# Patient Record
Sex: Male | Born: 1970 | Race: White | Hispanic: No | Marital: Married | Smoking: Former smoker
Health system: Southern US, Community
[De-identification: ages and names within clinical notes are randomized; demographics above are authoritative.]

## PROBLEM LIST (undated history)

## (undated) DIAGNOSIS — I10 Essential (primary) hypertension: Secondary | ICD-10-CM

## (undated) DIAGNOSIS — E785 Hyperlipidemia, unspecified: Secondary | ICD-10-CM

## (undated) DIAGNOSIS — F329 Major depressive disorder, single episode, unspecified: Secondary | ICD-10-CM

## (undated) HISTORY — PX: APPENDECTOMY: SHX54

## (undated) HISTORY — DX: Hyperlipidemia, unspecified: E78.5

---

## 2006-01-20 ENCOUNTER — Emergency Department (HOSPITAL_COMMUNITY): Admission: EM | Admit: 2006-01-20 | Discharge: 2006-01-20 | Payer: Self-pay | Admitting: Emergency Medicine

## 2007-10-18 ENCOUNTER — Emergency Department (HOSPITAL_COMMUNITY): Admission: EM | Admit: 2007-10-18 | Discharge: 2007-10-18 | Payer: Self-pay | Admitting: Emergency Medicine

## 2009-09-10 ENCOUNTER — Emergency Department (HOSPITAL_COMMUNITY): Admission: EM | Admit: 2009-09-10 | Discharge: 2009-09-11 | Payer: Self-pay | Admitting: Emergency Medicine

## 2011-03-24 LAB — URINE CULTURE: Culture: NO GROWTH

## 2011-03-24 LAB — GC/CHLAMYDIA PROBE AMP, GENITAL: Chlamydia, DNA Probe: NEGATIVE

## 2011-03-24 LAB — URINALYSIS, ROUTINE W REFLEX MICROSCOPIC
Ketones, ur: NEGATIVE
Protein, ur: NEGATIVE
Specific Gravity, Urine: 1.025
pH: 6

## 2013-03-10 ENCOUNTER — Encounter (HOSPITAL_COMMUNITY): Payer: Self-pay | Admitting: Emergency Medicine

## 2013-03-10 ENCOUNTER — Emergency Department (HOSPITAL_COMMUNITY)
Admission: EM | Admit: 2013-03-10 | Discharge: 2013-03-10 | Disposition: A | Discharge status: Home / Self Care | Payer: Self-pay | Attending: Emergency Medicine | Admitting: Emergency Medicine

## 2013-03-10 DIAGNOSIS — S80862A Insect bite (nonvenomous), left lower leg, initial encounter: Secondary | ICD-10-CM

## 2013-03-10 DIAGNOSIS — Y929 Unspecified place or not applicable: Secondary | ICD-10-CM | POA: Insufficient documentation

## 2013-03-10 DIAGNOSIS — I10 Essential (primary) hypertension: Secondary | ICD-10-CM | POA: Insufficient documentation

## 2013-03-10 DIAGNOSIS — Y939 Activity, unspecified: Secondary | ICD-10-CM | POA: Insufficient documentation

## 2013-03-10 DIAGNOSIS — S90569A Insect bite (nonvenomous), unspecified ankle, initial encounter: Secondary | ICD-10-CM | POA: Insufficient documentation

## 2013-03-10 HISTORY — DX: Essential (primary) hypertension: I10

## 2013-03-10 MED ORDER — PREDNISONE 50 MG PO TABS
60.0000 mg | ORAL_TABLET | Freq: Once | ORAL | Status: AC
  Administered 2013-03-10: 60 mg via ORAL
  Filled 2013-03-10: qty 1

## 2013-03-10 MED ORDER — IBUPROFEN 800 MG PO TABS
800.0000 mg | ORAL_TABLET | Freq: Once | ORAL | Status: AC
  Administered 2013-03-10: 800 mg via ORAL
  Filled 2013-03-10: qty 1

## 2013-03-10 MED ORDER — SULFAMETHOXAZOLE-TMP DS 800-160 MG PO TABS
1.0000 | ORAL_TABLET | Freq: Once | ORAL | Status: AC
  Administered 2013-03-10: 1 via ORAL
  Filled 2013-03-10: qty 1

## 2013-03-10 MED ORDER — ONDANSETRON HCL 4 MG PO TABS
4.0000 mg | ORAL_TABLET | Freq: Once | ORAL | Status: AC
  Administered 2013-03-10: 4 mg via ORAL
  Filled 2013-03-10: qty 1

## 2013-03-10 MED ORDER — SULFAMETHOXAZOLE-TMP DS 800-160 MG PO TABS: 1.0000 | ORAL_TABLET | Freq: Two times a day (BID) | ORAL | Status: DC

## 2013-03-10 MED ORDER — DEXAMETHASONE 4 MG PO TABS: 4.0000 mg | ORAL_TABLET | Freq: Two times a day (BID) | ORAL | Status: DC

## 2013-03-10 NOTE — ED Notes (Signed)
Pt c/o bug bite to the left leg last night. The area is red.

## 2013-03-10 NOTE — ED Notes (Signed)
nad noted prior to dc. Dc instructions reviewed and explained. 2 scripts given

## 2013-03-10 NOTE — ED Provider Notes (Signed)
CSN: [id number]     Arrival date & time 03/10/13  2303 History   First MD Initiated Contact with Patient 03/10/13 2308     Chief Complaint  Patient presents with  . Insect Bite   (Consider location/radiation/quality/duration/timing/severity/associated sxs/prior Treatment) HPI Comments: Pt c/o a red raised area of the lower left leg. This was noted last night and believed to be related to a bug bite. No hives or problems with breathing or walking reported. No fever or chills. Pt applied ice, but the area is worse today. He present to ED for evaluation of the bite as the area seems warm to touch and he is concerned for infection.  The history is provided by the patient.    Past Medical History  Diagnosis Date  . Hypertension    Past Surgical History  Procedure Laterality Date  . Appendectomy     History reviewed. No pertinent family history. History  Substance Use Topics  . Smoking status: Never Smoker   . Smokeless tobacco: Not on file  . Alcohol Use: No    Review of Systems  Constitutional: Negative for activity change.       All ROS Neg except as noted in HPI  HENT: Negative for nosebleeds and neck pain.   Eyes: Negative for photophobia and discharge.  Respiratory: Negative for cough, shortness of breath and wheezing.   Cardiovascular: Negative for chest pain and palpitations.  Gastrointestinal: Negative for abdominal pain and blood in stool.  Genitourinary: Negative for dysuria, frequency and hematuria.  Musculoskeletal: Negative for back pain and arthralgias.  Skin: Negative.   Neurological: Negative for dizziness, seizures and speech difficulty.  Psychiatric/Behavioral: Negative for hallucinations and confusion.    Allergies  Review of patient's allergies indicates no known allergies.  Home Medications  No current outpatient prescriptions on file. BP 136/82  Pulse 65  Temp(Src) 98.3 F (36.8 C) (Oral)  Resp 17  Ht 5' 8.5" (1.74 m)  Wt 200 lb (90.719 kg)   BMI 29.96 kg/m2 Physical Exam  Nursing note and vitals reviewed. Constitutional: He is oriented to person, place, and time. He appears well-developed and well-nourished.  Non-toxic appearance.  HENT:  Head: Normocephalic.  Right Ear: Tympanic membrane and external ear normal.  Left Ear: Tympanic membrane and external ear normal.  Eyes: EOM and lids are normal. Pupils are equal, round, and reactive to light.  Neck: Normal range of motion. Neck supple. Carotid bruit is not present.  Cardiovascular: Normal rate, regular rhythm, normal heart sounds, intact distal pulses and normal pulses.   Pulmonary/Chest: Breath sounds normal. No respiratory distress.  Abdominal: Soft. Bowel sounds are normal. There is no tenderness. There is no guarding.  Musculoskeletal: Normal range of motion.       Legs: No drainage from the bite area. No red streaking. FROm of the left lower extremity. No palpable inguinal nodes.  Lymphadenopathy:       Head (right side): No submandibular adenopathy present.       Head (left side): No submandibular adenopathy present.    He has no cervical adenopathy.  Neurological: He is alert and oriented to person, place, and time. He has normal strength. No cranial nerve deficit or sensory deficit.  Skin: Skin is warm and dry.  Psychiatric: He has a normal mood and affect. His speech is normal.    ED Course  Procedures (including critical care time) Labs Review Labs Reviewed - No data to display Imaging Review No results found.  MDM  No diagnosis  found. **I have reviewed nursing notes, vital signs, and all appropriate lab and imaging results for this patient.*  Pt sustained a bug bite to the left lower leg. No reported fever or n/v. No c/o pain to palpation or with walking. Plan - Rx for decadron and septra given to the patient. Pt to use warm tub soaks. He will return if any changes or problem.  Kathie Dike, PA-C 03/16/13 1406

## 2013-03-17 NOTE — ED Provider Notes (Signed)
Medical screening examination/treatment/procedure(s) were performed by non-physician practitioner and as supervising physician I was immediately available for consultation/collaboration.   Dione Booze, MD 03/17/13 [id number]

## 2013-06-29 DIAGNOSIS — F32A Depression, unspecified: Secondary | ICD-10-CM

## 2013-06-29 HISTORY — DX: Depression, unspecified: F32.A

## 2018-07-14 ENCOUNTER — Encounter (HOSPITAL_COMMUNITY): Payer: Self-pay | Admitting: Emergency Medicine

## 2018-07-14 ENCOUNTER — Other Ambulatory Visit: Payer: Self-pay | Admitting: Nurse Practitioner

## 2018-07-14 ENCOUNTER — Other Ambulatory Visit: Payer: Self-pay

## 2018-07-14 ENCOUNTER — Emergency Department (HOSPITAL_COMMUNITY): Payer: BLUE CROSS/BLUE SHIELD

## 2018-07-14 ENCOUNTER — Emergency Department (HOSPITAL_COMMUNITY)
Admission: EM | Admit: 2018-07-14 | Discharge: 2018-07-14 | Disposition: A | Discharge status: Home / Self Care | Payer: BLUE CROSS/BLUE SHIELD | Attending: Emergency Medicine | Admitting: Emergency Medicine

## 2018-07-14 DIAGNOSIS — I10 Essential (primary) hypertension: Secondary | ICD-10-CM | POA: Diagnosis not present

## 2018-07-14 DIAGNOSIS — Z79899 Other long term (current) drug therapy: Secondary | ICD-10-CM | POA: Diagnosis not present

## 2018-07-14 DIAGNOSIS — K5792 Diverticulitis of intestine, part unspecified, without perforation or abscess without bleeding: Secondary | ICD-10-CM | POA: Diagnosis not present

## 2018-07-14 DIAGNOSIS — R1032 Left lower quadrant pain: Secondary | ICD-10-CM | POA: Diagnosis present

## 2018-07-14 HISTORY — DX: Major depressive disorder, single episode, unspecified: F32.9

## 2018-07-14 LAB — COMPREHENSIVE METABOLIC PANEL WITH GFR
ALT: 30 U/L (ref 0–44)
AST: 25 U/L (ref 15–41)
Albumin: 3.5 g/dL (ref 3.5–5.0)
Alkaline Phosphatase: 56 U/L (ref 38–126)
Anion gap: 7 (ref 5–15)
BUN: 16 mg/dL (ref 6–20)
CO2: 26 mmol/L (ref 22–32)
Calcium: 8.6 mg/dL — ABNORMAL LOW (ref 8.9–10.3)
Chloride: 107 mmol/L (ref 98–111)
Creatinine, Ser: 0.66 mg/dL (ref 0.61–1.24)
GFR calc Af Amer: >60 mL/min
GFR calc non Af Amer: >60 mL/min
Glucose, Bld: 87 mg/dL (ref 70–99)
Potassium: 3.6 mmol/L (ref 3.5–5.1)
Sodium: 140 mmol/L (ref 135–145)
Total Bilirubin: 0.6 mg/dL (ref 0.3–1.2)
Total Protein: 6.9 g/dL (ref 6.5–8.1)

## 2018-07-14 LAB — URINALYSIS, ROUTINE W REFLEX MICROSCOPIC
Bilirubin Urine: NEGATIVE
Glucose, UA: NEGATIVE mg/dL
Hgb urine dipstick: NEGATIVE
KETONES UR: NEGATIVE mg/dL
LEUKOCYTES UA: NEGATIVE
Nitrite: NEGATIVE
PROTEIN: NEGATIVE mg/dL
Specific Gravity, Urine: >1.046 — ABNORMAL HIGH (ref 1.005–1.030)
pH: 6 (ref 5.0–8.0)

## 2018-07-14 LAB — CBC WITH DIFFERENTIAL/PLATELET
Abs Immature Granulocytes: 0.02 10*3/uL (ref 0.00–0.07)
BASOS PCT: 1 %
Basophils Absolute: 0 10*3/uL (ref 0.0–0.1)
EOS ABS: 0.1 10*3/uL (ref 0.0–0.5)
EOS PCT: 3 %
HCT: 40.6 % (ref 39.0–52.0)
Hemoglobin: 13.1 g/dL (ref 13.0–17.0)
IMMATURE GRANULOCYTES: 0 %
Lymphocytes Relative: 23 %
Lymphs Abs: 1.2 10*3/uL (ref 0.7–4.0)
MCH: 26.9 pg (ref 26.0–34.0)
MCHC: 32.3 g/dL (ref 30.0–36.0)
MCV: 83.4 fL (ref 80.0–100.0)
MONO ABS: 0.6 10*3/uL (ref 0.1–1.0)
Monocytes Relative: 12 %
NEUTROS PCT: 61 %
Neutro Abs: 3.3 10*3/uL (ref 1.7–7.7)
PLATELETS: 181 10*3/uL (ref 150–400)
RBC: 4.87 MIL/uL (ref 4.22–5.81)
RDW: 12.5 % (ref 11.5–15.5)
WBC: 5.4 10*3/uL (ref 4.0–10.5)
nRBC: 0 % (ref 0.0–0.2)

## 2018-07-14 LAB — LIPASE, BLOOD: Lipase: 32 U/L (ref 11–51)

## 2018-07-14 MED ORDER — CIPROFLOXACIN HCL 250 MG PO TABS
500.0000 mg | ORAL_TABLET | Freq: Once | ORAL | Status: AC
  Administered 2018-07-14: 500 mg via ORAL
  Filled 2018-07-14: qty 2

## 2018-07-14 MED ORDER — CIPROFLOXACIN HCL 500 MG PO TABS: 500.0000 mg | ORAL_TABLET | Freq: Two times a day (BID) | ORAL | 0 refills | Status: DC

## 2018-07-14 MED ORDER — IOHEXOL 300 MG/ML  SOLN
100.0000 mL | Freq: Once | INTRAMUSCULAR | Status: AC | PRN
  Administered 2018-07-14: 100 mL via INTRAVENOUS

## 2018-07-14 MED ORDER — METRONIDAZOLE 500 MG PO TABS: 500.0000 mg | ORAL_TABLET | Freq: Four times a day (QID) | ORAL | 0 refills | Status: DC

## 2018-07-14 MED ORDER — METRONIDAZOLE 500 MG PO TABS
500.0000 mg | ORAL_TABLET | Freq: Once | ORAL | Status: AC
  Administered 2018-07-14: 500 mg via ORAL
  Filled 2018-07-14: qty 1

## 2018-07-14 NOTE — Discharge Instructions (Addendum)
Prescription for 2 antibiotics.  Try to eat a well-balanced diet.  Follow-up with your primary care doctor.

## 2018-07-14 NOTE — ED Provider Notes (Signed)
[facility] EMERGENCY DEPARTMENT Provider Note   CSN: [id number] Arrival date & time: 07/14/18  1156     History   Chief Complaint Chief Complaint  Patient presents with  . Abdominal Pain    HPI Alan Guerrero is a 48 y.o. male.  Left lower quadrant abdominal pain for approximately 5 to 6 days.  Patient has been able to eat without vomiting.  No fever, chills, dysuria, hematuria, change in bowel habits.  He does however have night sweats.  This symptom is unusual for patient.  He is normally healthy.  He was seen in urgent care center today and sent to the emergency department for further evaluation.  Severity of symptoms is moderate.  Palpation makes pain worse.     Past Medical History:  Diagnosis Date  . Depression 2015  . Hypertension     There are no active problems to display for this patient.   Past Surgical History:  Procedure Laterality Date  . APPENDECTOMY          Home Medications    Prior to Admission medications   Medication Sig Start Date End Date Taking? Authorizing Provider  sertraline (ZOLOFT) 50 MG tablet Take by mouth. 09/22/17  Yes [provider]  ciprofloxacin (CIPRO) 500 MG tablet Take 1 tablet (500 mg total) by mouth 2 (two) times daily. 07/14/18   Donnetta Hutching, MD  metroNIDAZOLE (FLAGYL) 500 MG tablet Take 1 tablet (500 mg total) by mouth 4 (four) times daily. 07/14/18   Donnetta Hutching, MD  sulfamethoxazole-trimethoprim (BACTRIM DS) 800-160 MG per tablet Take 1 tablet by mouth 2 (two) times daily. Patient not taking: Reported on 07/14/2018 03/10/13   Ivery Quale, PA-C    Family History History reviewed. No pertinent family history.  Social History Social History   Tobacco Use  . Smoking status: Never Smoker  . Smokeless tobacco: Never Used  Substance Use Topics  . Alcohol use: No  . Drug use: No     Allergies   Codeine   Review of Systems Review of Systems  All other systems reviewed and are  negative.    Physical Exam Updated Vital Signs BP 139/87   Pulse 67   Temp 98 F (36.7 C)   Resp 18   Ht 5' 8.5" (1.74 m)   Wt 91.2 kg   SpO2 100%   BMI 30.12 kg/m   Physical Exam Vitals signs and nursing note reviewed.  Constitutional:      Appearance: He is well-developed.     Comments: nad  HENT:     Head: Normocephalic and atraumatic.  Eyes:     Conjunctiva/sclera: Conjunctivae normal.  Neck:     Musculoskeletal: Neck supple.  Cardiovascular:     Rate and Rhythm: Normal rate and regular rhythm.  Pulmonary:     Effort: Pulmonary effort is normal.     Breath sounds: Normal breath sounds.  Abdominal:     General: Bowel sounds are normal.     Palpations: Abdomen is soft.     Comments: Tender left lower quadrant.  Musculoskeletal: Normal range of motion.  Skin:    General: Skin is warm and dry.  Neurological:     Mental Status: He is alert and oriented to person, place, and time.  Psychiatric:        Behavior: Behavior normal.      ED Treatments / Results  Labs (all labs ordered are listed, but only abnormal results are displayed) Labs Reviewed  COMPREHENSIVE METABOLIC PANEL -  Abnormal; Notable for the following components:      Result Value   Calcium 8.6 (*)    All other components within normal limits  URINALYSIS, ROUTINE W REFLEX MICROSCOPIC - Abnormal; Notable for the following components:   Color, Urine STRAW (*)    Specific Gravity, Urine >1.046 (*)    All other components within normal limits  CBC WITH DIFFERENTIAL/PLATELET  LIPASE, BLOOD    EKG None  Radiology Ct Abdomen Pelvis W Contrast  Result Date: 07/14/2018 CLINICAL DATA:  Acute abdominal pain.  Evaluate for diverticulitis. EXAM: CT ABDOMEN AND PELVIS WITH CONTRAST TECHNIQUE: Multidetector CT imaging of the abdomen and pelvis was performed using the standard protocol following bolus administration of intravenous contrast. CONTRAST:  100mL OMNIPAQUE IOHEXOL 300 MG/ML  SOLN COMPARISON:   None. FINDINGS: Lower chest: Normal heart size. Dependent atelectasis within the bilateral lower lobes. No pleural effusion. Hepatobiliary: The liver is normal in size and contour. No focal lesion is identified. Gallbladder is unremarkable. No intrahepatic or extrahepatic biliary ductal dilatation. Pancreas: Unremarkable Spleen: Unremarkable Adrenals/Urinary Tract: Normal adrenal glands. Kidneys enhance symmetrically with contrast. No hydronephrosis. Urinary bladder is unremarkable. Stomach/Bowel: There is focal circumferential wall thickening of the descending colon (image 60; series 2) with surrounding fat stranding and fluid. Findings favored to represent diverticulitis. No extreme bowel obstruction. No evidence for free intraperitoneal air. Normal morphology of the stomach. Vascular/Lymphatic: Normal caliber abdominal aorta. Peripheral calcified atherosclerotic plaque. No retroperitoneal lymphadenopathy. Reproductive: Prostate unremarkable. Other: None. Musculoskeletal: Lumbar spine degenerative changes. No aggressive or acute appearing osseous lesions. IMPRESSION: Focal circumferential wall thickening of the descending colon with pericolonic fat stranding and fluid most compatible with acute colonic diverticulitis. No CT evidence to suggest perforation. Electronically Signed   By: Annia Beltrew  Davis M.D.   On: 07/14/2018 15:29    Procedures Procedures (including critical care time)  Medications Ordered in ED Medications  iohexol (OMNIPAQUE) 300 MG/ML solution 100 mL (100 mLs Intravenous Contrast Given 07/14/18 1508)  ciprofloxacin (CIPRO) tablet 500 mg (500 mg Oral Given 07/14/18 1833)  metroNIDAZOLE (FLAGYL) tablet 500 mg (500 mg Oral Given 07/14/18 1833)     Initial Impression / Assessment and Plan / ED Course  I have reviewed the triage vital signs and the nursing notes.  Pertinent labs & imaging results that were available during my care of the patient were reviewed by me and considered in my medical  decision making (see chart for details).     History and physical consistent with diverticulitis.  CT scan confirms same.  Patient is hemodynamically stable.  Will treat as an outpatient with oral Cipro and Flagyl.  Discussed findings with the patient and his wife.  Final Clinical Impressions(s) / ED Diagnoses   Final diagnoses:  Diverticulitis    ED Discharge Orders         Ordered    ciprofloxacin (CIPRO) 500 MG tablet  2 times daily     07/14/18 1822    metroNIDAZOLE (FLAGYL) 500 MG tablet  4 times daily     07/14/18 Aram [id number]           Dacoda Finlay, MD 07/14/18 2126

## 2018-07-14 NOTE — ED Triage Notes (Signed)
Pt c/o LLQ abd pain x 1 week, pt saw PCP and was sent to ED stating he needs a CT scan, denies n/v/d, unsure of fever

## 2020-12-01 IMAGING — CT CT ABD-PELV W/ CM
2 of 5 series · 16 of 46 positions shown, 18 images · IV contrast (Isovue)
Comparison: None.

CLINICAL DATA: Acute abdominal pain.  Evaluate for diverticulitis.

EXAM:
CT ABDOMEN AND PELVIS WITH CONTRAST
TECHNIQUE: Multidetector CT imaging of the abdomen and pelvis was performed
using the standard protocol following bolus administration of
intravenous contrast.
CONTRAST:  100mL OMNIPAQUE IOHEXOL 300 MG/ML  SOLN

[Series 2: axial st · axial · 0.84mm/px · z∈[+987,+1417]mm · 13 of 98 slices shown, 15 images]
[im 6/98  soft-tissue]
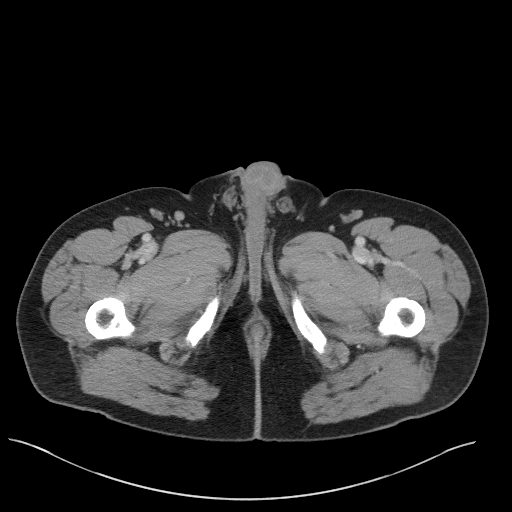
[im 6/98  bone]
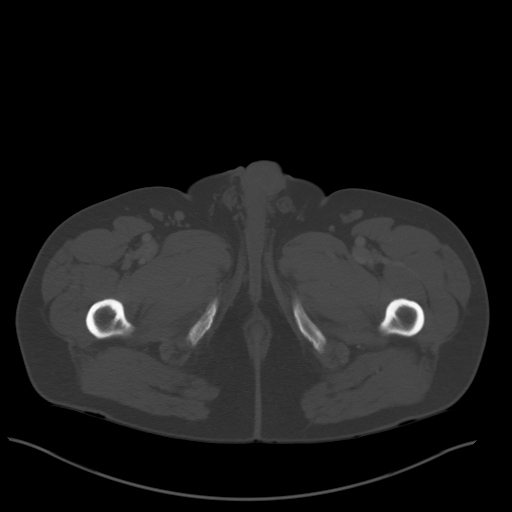
[im 11/98  soft-tissue]
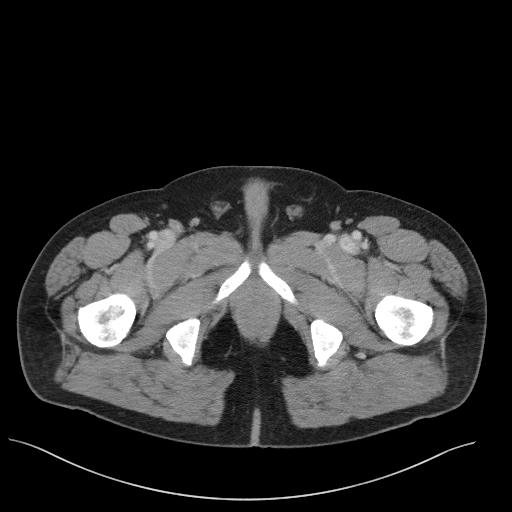
[im 22/98  soft-tissue]
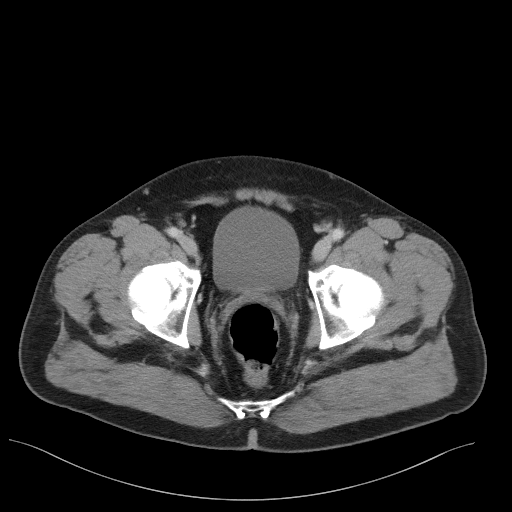
[im 27/98  soft-tissue]
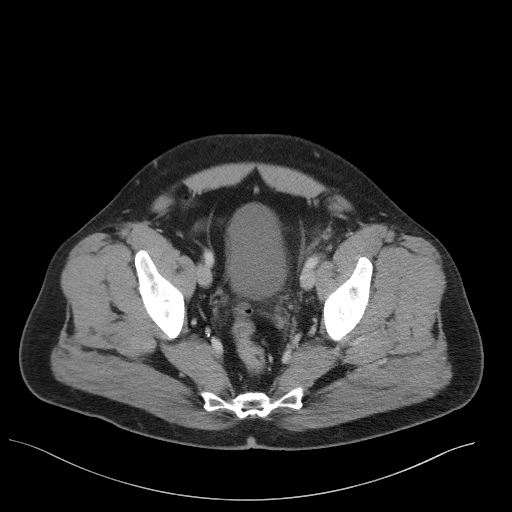
[im 33/98  soft-tissue]
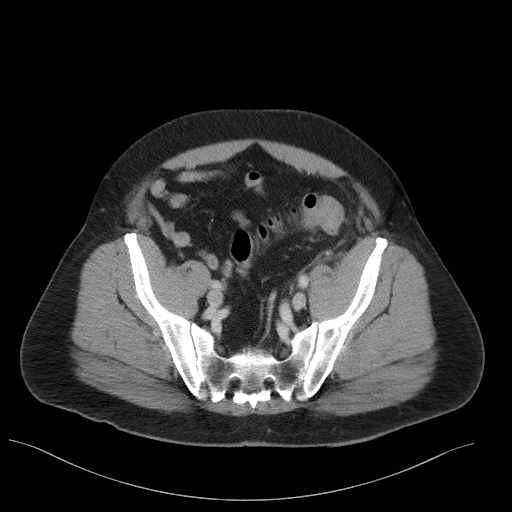
[im 44/98  soft-tissue]
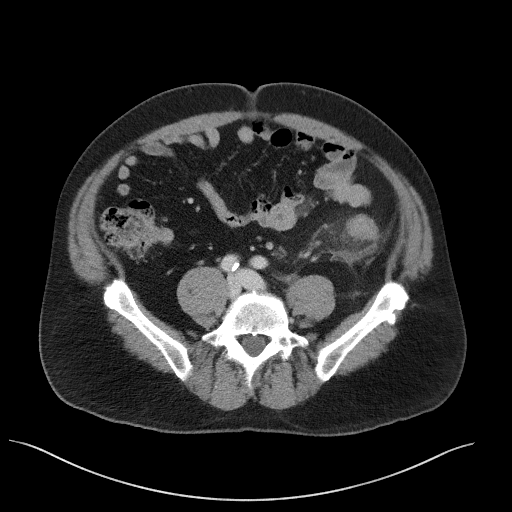
[im 49/98  soft-tissue]
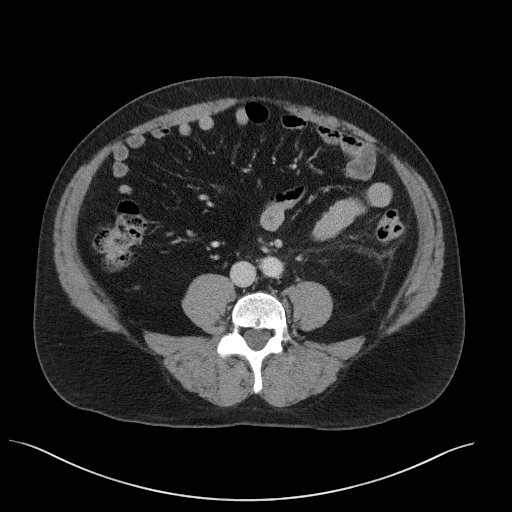
[im 54/98  soft-tissue]
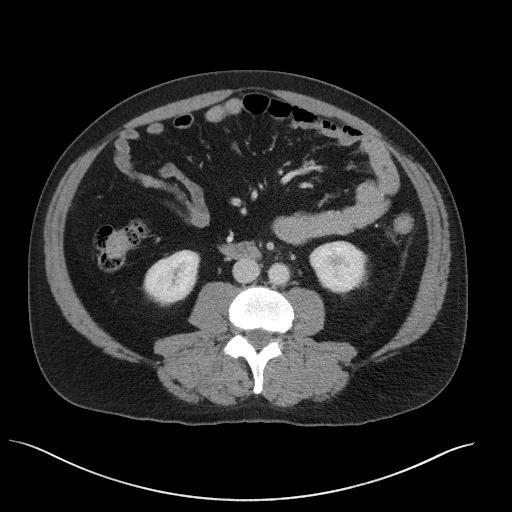
[im 65/98  soft-tissue]
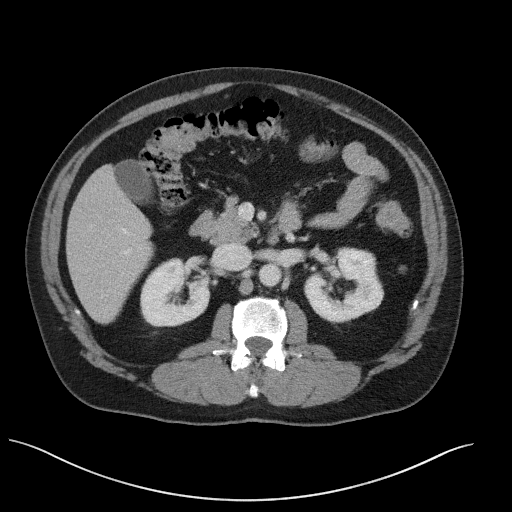
[im 65/98  bone]
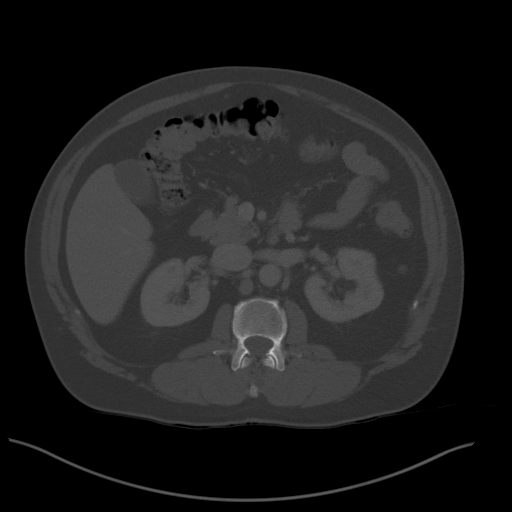
[im 71/98  soft-tissue]
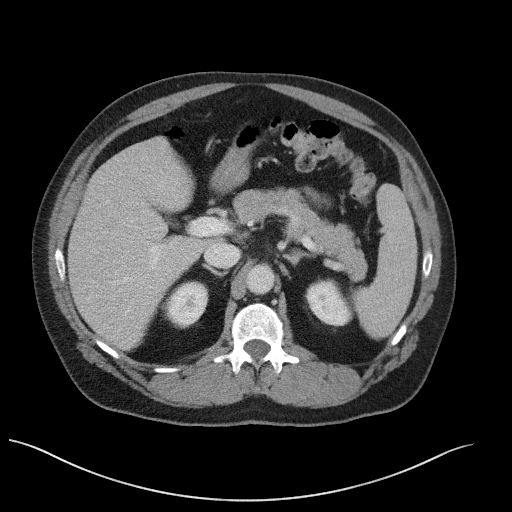
[im 76/98  soft-tissue]
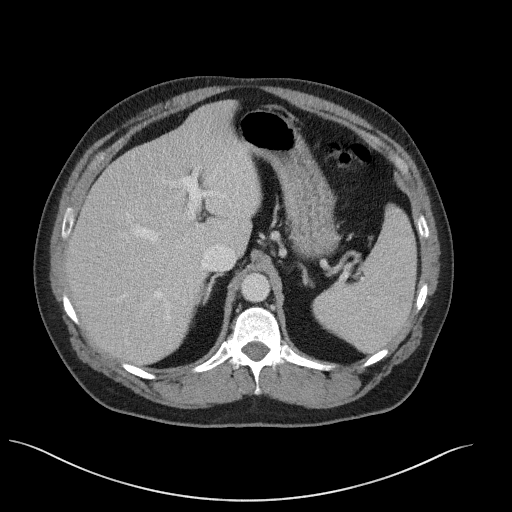
[im 87/98  soft-tissue]
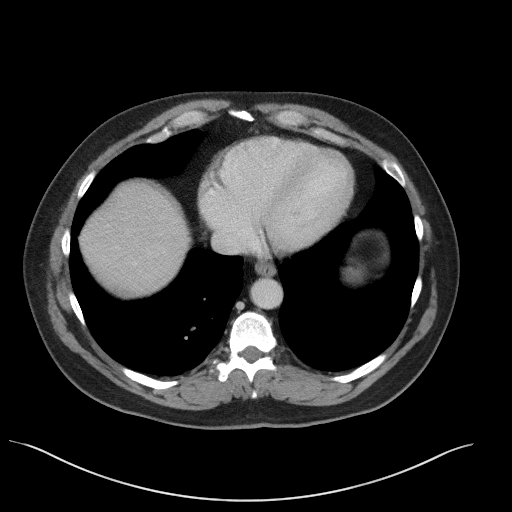
[im 92/98  soft-tissue]
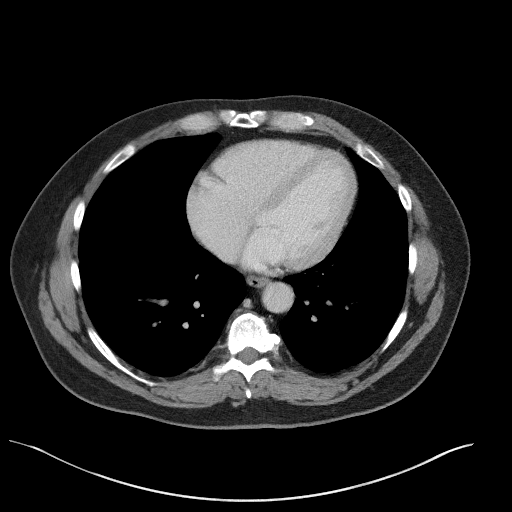

[Series 6: coronal st · coronal · 0.86mm/px · 3 of 117 slices shown]
[im 39/117  soft-tissue]
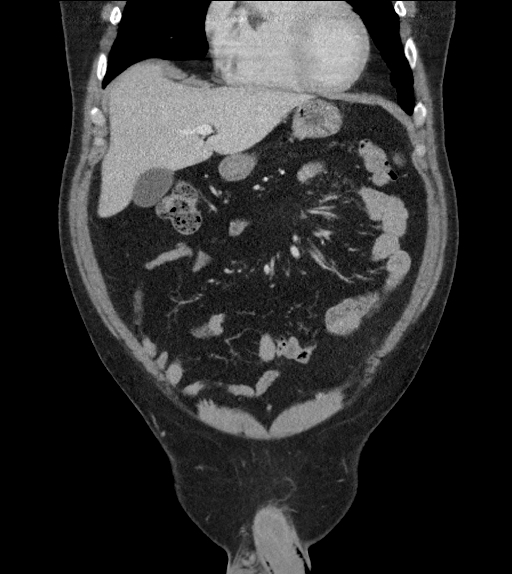
[im 52/117  soft-tissue]
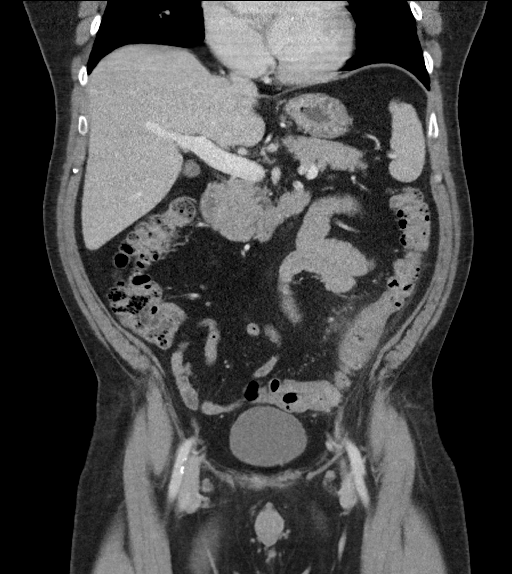
[im 65/117  soft-tissue]
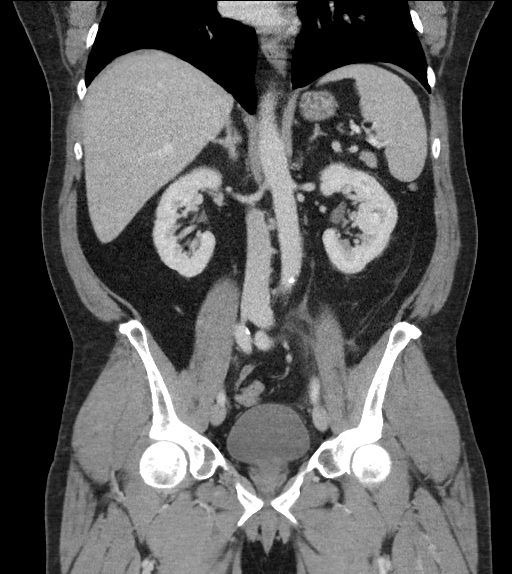

[16 of 46 positions shown; findings below may reference images not displayed]

FINDINGS: Lower chest: Normal heart size. Dependent atelectasis within the
bilateral lower lobes. No pleural effusion.

Hepatobiliary: The liver is normal in size and contour. No focal
lesion is identified. Gallbladder is unremarkable. No intrahepatic
or extrahepatic biliary ductal dilatation.

Pancreas: Unremarkable

Spleen: Unremarkable

Adrenals/Urinary Tract: Normal adrenal glands. Kidneys enhance
symmetrically with contrast. No hydronephrosis. Urinary bladder is
unremarkable.

Stomach/Bowel: There is focal circumferential wall thickening of the
descending colon (image 60; series 2) with surrounding fat stranding
and fluid. Findings favored to represent diverticulitis. No extreme
bowel obstruction. No evidence for free intraperitoneal air. Normal
morphology of the stomach.

Vascular/Lymphatic: Normal caliber abdominal aorta. Peripheral
calcified atherosclerotic plaque. No retroperitoneal
lymphadenopathy.

Reproductive: Prostate unremarkable.

Other: None.

Musculoskeletal: Lumbar spine degenerative changes. No aggressive or
acute appearing osseous lesions.
IMPRESSION: Focal circumferential wall thickening of the descending colon with
pericolonic fat stranding and fluid most compatible with acute
colonic diverticulitis. No CT evidence to suggest perforation.

## 2023-02-03 ENCOUNTER — Encounter: Payer: Self-pay | Admitting: Internal Medicine

## 2023-05-18 ENCOUNTER — Encounter: Payer: Self-pay | Admitting: Internal Medicine

## 2023-05-18 ENCOUNTER — Ambulatory Visit (INDEPENDENT_AMBULATORY_CARE_PROVIDER_SITE_OTHER): Payer: 59 | Admitting: Internal Medicine

## 2023-05-18 VITALS — BP 160/80 | HR 64 | Ht 65.5 in | Wt 203.5 lb

## 2023-05-18 DIAGNOSIS — R935 Abnormal findings on diagnostic imaging of other abdominal regions, including retroperitoneum: Secondary | ICD-10-CM | POA: Diagnosis not present

## 2023-05-18 DIAGNOSIS — K5732 Diverticulitis of large intestine without perforation or abscess without bleeding: Secondary | ICD-10-CM | POA: Diagnosis not present

## 2023-05-18 DIAGNOSIS — Z1211 Encounter for screening for malignant neoplasm of colon: Secondary | ICD-10-CM

## 2023-05-18 MED ORDER — NA SULFATE-K SULFATE-MG SULF 17.5-3.13-1.6 GM/177ML PO SOLN: 1.0000 | Freq: Once | ORAL | 0 refills | Status: AC

## 2023-05-18 NOTE — Progress Notes (Signed)
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  This after a bout of recurrent diverticulitis.  Patient tells me that he has had bouts of diverticulitis over the years, just a few.  Most recently August 2024.  He developed left lower quadrant pain and was evaluated in the emergency room.  CT scan revealed changes consistent with uncomplicated sigmoid diverticulitis.  He was treated with antibiotics and improved.  Currently feeling well.  No prior history of colonoscopy.  No family history of colon cancer.  CT scan with uncomplicated diverticulitis reviewed.  Blood work from that time shows normal comprehensive metabolic panel except for potassium of 3.5.  Normal CBC with hemoglobin 13.9.  REVIEW OF SYSTEMS:  All non-GI ROS negative unless otherwise stated in the HPI except for back pain, right knee pain  Past Medical History:  Diagnosis Date   Depression 2015   HLD (hyperlipidemia)    Hypertension     Past Surgical History:  Procedure Laterality Date   APPENDECTOMY      Social History Alan Guerrero  reports that he quit smoking about 26 years ago. His smoking use included cigarettes. He has never used smokeless tobacco. He reports that he does not drink alcohol and does not use drugs.  family history includes Alcoholism in his sister; Cataracts in his mother; Diabetes in his mother; Heart disease in his maternal grandfather and mother; Hiatal hernia in his father; Hypertension in his mother; Obesity in his paternal grandmother; Prostate cancer in his paternal grandfather; Skin cancer in his father; Stroke in his maternal grandfather.  Allergies  Allergen Reactions   Codeine Nausea And Vomiting       PHYSICAL EXAMINATION: Vital signs: BP (!) 140/78 (BP Location: Left Arm, Patient Position: Sitting, Cuff Size: Normal)   Pulse 64   Ht 5' 5.5"  (1.664 m) Comment: height measured without shoes  Wt 203 lb 8 oz (92.3 kg)   BMI 33.35 kg/m   Constitutional: generally well-appearing, no acute distress Psychiatric: alert and oriented x3, cooperative Eyes: extraocular movements intact, anicteric, conjunctiva pink Mouth: oral pharynx moist, no lesions Neck: supple no lymphadenopathy Cardiovascular: heart regular rate and rhythm, no murmur Lungs: clear to auscultation bilaterally Abdomen: soft, nontender, nondistended, no obvious ascites, no peritoneal signs, normal bowel sounds, no organomegaly Rectal: Deferred until colonoscopy Extremities: no clubbing, cyanosis, or lower extremity edema bilaterally Skin: no lesions on visible extremities Neuro: No focal deficits.  Nerves intact  ASSESSMENT:  1.  Acute uncomplicated diverticulitis.  Improved after antibiotic therapy. 2.  Colon cancer screening.  Average risk. 3.  General Medical problems.  Stable.   PLAN:  1.  Discussion on diverticulitis 2.  Schedule colonoscopy.The nature of the procedure, as well as the risks, benefits, and alternatives were carefully and thoroughly reviewed with the patient. Ample time for discussion and questions allowed. The patient understood, was satisfied, and agreed to proceed. 3.  Ongoing general medical care with Dr. Cyndia Bent

## 2023-05-18 NOTE — Patient Instructions (Signed)
You have been scheduled for a colonoscopy. Please follow written instructions given to you at your visit today.   Please pick up your prep supplies at the pharmacy within the next 1-3 days.  If you use inhalers (even only as needed), please bring them with you on the day of your procedure.  DO NOT TAKE 7 DAYS PRIOR TO TEST- Trulicity (dulaglutide) Ozempic, Wegovy (semaglutide) Mounjaro (tirzepatide) Bydureon Bcise (exanatide extended release)  DO NOT TAKE 1 DAY PRIOR TO YOUR TEST Rybelsus (semaglutide) Adlyxin (lixisenatide) Victoza (liraglutide) Byetta (exanatide) ___________________________________________________________________________ _______________________________________________________  If your blood pressure at your visit was 140/90 or greater, please contact your primary care physician to follow up on this.  _______________________________________________________  If you are age 54 or older, your body mass index should be between 23-30. Your Body mass index is 33.35 kg/m. If this is out of the aforementioned range listed, please consider follow up with your Primary Care Provider.  If you are age 58 or younger, your body mass index should be between 19-25. Your Body mass index is 33.35 kg/m. If this is out of the aformentioned range listed, please consider follow up with your Primary Care Provider.   ________________________________________________________  The [facility] providers would like to encourage you to use [facility] to communicate with providers for non-urgent requests or questions.  Due to long hold times on the telephone, sending your provider a message by [facility] may be a faster and more efficient way to get a response.  Please allow 48 business hours for a response.  Please remember that this is for non-urgent requests.  _______________________________________________________

## 2023-06-28 ENCOUNTER — Encounter: Payer: Self-pay | Admitting: Internal Medicine

## 2023-06-28 ENCOUNTER — Ambulatory Visit: Payer: 59 | Admitting: Internal Medicine

## 2023-06-28 VITALS — BP 118/76 | HR 47 | Temp 97.7°F | Resp 16 | Ht 65.0 in | Wt 203.0 lb

## 2023-06-28 DIAGNOSIS — D124 Benign neoplasm of descending colon: Secondary | ICD-10-CM | POA: Diagnosis not present

## 2023-06-28 DIAGNOSIS — K648 Other hemorrhoids: Secondary | ICD-10-CM

## 2023-06-28 DIAGNOSIS — D123 Benign neoplasm of transverse colon: Secondary | ICD-10-CM | POA: Diagnosis not present

## 2023-06-28 DIAGNOSIS — K635 Polyp of colon: Secondary | ICD-10-CM

## 2023-06-28 DIAGNOSIS — Z1211 Encounter for screening for malignant neoplasm of colon: Secondary | ICD-10-CM | POA: Diagnosis present

## 2023-06-28 DIAGNOSIS — K5732 Diverticulitis of large intestine without perforation or abscess without bleeding: Secondary | ICD-10-CM

## 2023-06-28 DIAGNOSIS — D120 Benign neoplasm of cecum: Secondary | ICD-10-CM | POA: Diagnosis not present

## 2023-06-28 DIAGNOSIS — K5730 Diverticulosis of large intestine without perforation or abscess without bleeding: Secondary | ICD-10-CM | POA: Diagnosis not present

## 2023-06-28 MED ORDER — SODIUM CHLORIDE 0.9 % IV SOLN: 500.0000 mL | Freq: Once | INTRAVENOUS | Status: DC

## 2023-06-28 NOTE — Progress Notes (Signed)
Report to PACU, RN, vss, BBS= Clear.  

## 2023-06-28 NOTE — Patient Instructions (Addendum)
Repeat colonoscopy in 3 years for surveillance.                           - Patient has a contact number available for                            emergencies. The signs and symptoms of potential                            delayed complications were discussed with the                            patient. Return to normal activities tomorrow.                            Written discharge instructions were provided to the                            patient.                           - Resume previous diet.                           - Continue present medications.                           - Await pathology results.  Handout on polyps, diverticulosis and hemorrhoids given.     YOU HAD AN ENDOSCOPIC PROCEDURE TODAY AT THE [facility]:   Refer to the procedure report that was given to you for any specific questions about what was found during the examination.  If the procedure report does not answer your questions, please call your gastroenterologist to clarify.  If you requested that your care partner not be given the details of your procedure findings, then the procedure report has been included in a sealed envelope for you to review at your convenience later.  YOU SHOULD EXPECT: Some feelings of bloating in the abdomen. Passage of more gas than usual.  Walking can help get rid of the air that was put into your GI tract during the procedure and reduce the bloating. If you had a lower endoscopy (such as a colonoscopy or flexible sigmoidoscopy) you may notice spotting of blood in your stool or on the toilet paper. If you underwent a bowel prep for your procedure, you may not have a normal bowel movement for a few days.  Please Note:  You might notice some irritation and congestion in your nose or some drainage.  This is from the oxygen used during your procedure.  There is no need for concern and it should clear up in a day or so.  SYMPTOMS TO REPORT IMMEDIATELY:  Following lower  endoscopy (colonoscopy or flexible sigmoidoscopy):  Excessive amounts of blood in the stool  Significant tenderness or worsening of abdominal pains  Swelling of the abdomen that is new, acute  Fever of 100F or higher   For urgent or emergent issues, a gastroenterologist can be reached at any hour by calling [phone]. Do not use MyChart messaging for urgent concerns.    DIET:  We do recommend a small meal at first, but then you may proceed to your regular diet.  Drink plenty of fluids but you should avoid alcoholic beverages for 24 hours.  ACTIVITY:  You should plan to take it easy for the rest of today and you should NOT DRIVE or use heavy machinery until tomorrow (because of the sedation medicines used during the test).    FOLLOW UP: Our staff will call the number listed on your records the next business day following your procedure.  We will call around 7:15- 8:00 am to check on you and address any questions or concerns that you may have regarding the information given to you following your procedure. If we do not reach you, we will leave a message.     If any biopsies were taken you will be contacted by phone or by letter within the next 1-3 weeks.  Please call us at [phone] if you have not heard about the biopsies in 3 weeks.    SIGNATURES/CONFIDENTIALITY: You and/or your care partner have signed paperwork which will be entered into your electronic medical record.  These signatures attest to the fact that that the information above on your After Visit Summary has been reviewed and is understood.  Full responsibility of the confidentiality of this discharge information lies with you and/or your care-partner.

## 2023-06-28 NOTE — Progress Notes (Signed)
Called to room to assist during endoscopic procedure.  Patient ID and intended procedure confirmed with present staff. Received instructions for my participation in the procedure from the performing physician.  

## 2023-06-28 NOTE — Op Note (Signed)
[facility] Patient Name: Alan Guerrero Procedure Date: 06/28/2023 2:49 PM MRN: [id number] Endoscopist: Wilhemina Bonito. Marina Goodell , MD, [id number] Age: 52 Referring MD:  Date of Birth: 02-09-1971 Gender: Male Account #: [id number] Procedure:                Colonoscopy with cold snare polypectomy x 3; hot                            snare polypectomy x 2 Indications:              Screening for colorectal malignant neoplasm Medicines:                Monitored Anesthesia Care Procedure:                Pre-Anesthesia Assessment:                           - Prior to the procedure, a History and Physical                            was performed, and patient medications and                            allergies were reviewed. The patient's tolerance of                            previous anesthesia was also reviewed. The risks                            and benefits of the procedure and the sedation                            options and risks were discussed with the patient.                            All questions were answered, and informed consent                            was obtained. Prior Anticoagulants: The patient has                            taken no anticoagulant or antiplatelet agents. ASA                            Grade Assessment: II - A patient with mild systemic                            disease. After reviewing the risks and benefits,                            the patient was deemed in satisfactory condition to                            undergo the procedure.  After obtaining informed consent, the colonoscope                            was passed under direct vision. Throughout the                            procedure, the patient's blood pressure, pulse, and                            oxygen saturations were monitored continuously. The                            CF [id number] #[id number] was introduced through the anus                            and  advanced to the the cecum, identified by                            appendiceal orifice and ileocecal valve. The                            ileocecal valve, appendiceal orifice, and rectum                            were photographed. The quality of the bowel                            preparation was excellent. The colonoscopy was                            performed without difficulty. The patient tolerated                            the procedure well. The bowel preparation used was                            SUPREP via split dose instruction. Scope In: 3:01:20 PM Scope Out: 3:18:15 PM Scope Withdrawal Time: 0 hours 15 minutes 22 seconds  Total Procedure Duration: 0 hours 16 minutes 55 seconds  Findings:                 Three polyps were found in the transverse colon and                            cecum. The polyps were 2 to 6 mm in size. These                            polyps were removed with a cold snare. Resection                            and retrieval were complete.                           Two pedunculated polyps were found  in the                            descending colon. The polyps were 8 to 15 mm in                            size. These polyps were removed with a hot snare.                            Resection and retrieval were complete.                           Many diverticula were found in the cecum, left                            colon and right colon.                           Internal hemorrhoids were found during                            retroflexion. The hemorrhoids were small.                           The exam was otherwise without abnormality on                            direct and retroflexion views. Complications:            No immediate complications. Estimated blood loss:                            None. Estimated Blood Loss:     Estimated blood loss: none. Impression:               - Three 2 to 6 mm polyps in the transverse colon                             and in the cecum, removed with a cold snare.                            Resected and retrieved.                           - Two 8 to 15 mm polyps in the descending colon,                            removed with a hot snare. Resected and retrieved.                           - Diverticulosis in the cecum, in the left colon                            and in the right colon.                           -  Internal hemorrhoids.                           - The examination was otherwise normal on direct                            and retroflexion views. Recommendation:           - Repeat colonoscopy in 3 years for surveillance.                           - Patient has a contact number available for                            emergencies. The signs and symptoms of potential                            delayed complications were discussed with the                            patient. Return to normal activities tomorrow.                            Written discharge instructions were provided to the                            patient.                           - Resume previous diet.                           - Continue present medications.                           - Await pathology results. Wilhemina Bonito. Marina Goodell, MD 06/28/2023 3:26:46 PM This report has been signed electronically.

## 2023-06-28 NOTE — Progress Notes (Signed)
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  This after a bout of recurrent diverticulitis.   Patient tells me that he has had bouts of diverticulitis over the years, just a few.  Most recently August 2024.  He developed left lower quadrant pain and was evaluated in the emergency room.  CT scan revealed changes consistent with uncomplicated sigmoid diverticulitis.  He was treated with antibiotics and improved.  Currently feeling well.  No prior history of colonoscopy.  No family history of colon cancer.   CT scan with uncomplicated diverticulitis reviewed.  Blood work from that time shows normal comprehensive metabolic panel except for potassium of 3.5.  Normal CBC with hemoglobin 13.9.   REVIEW OF SYSTEMS:   All non-GI ROS negative unless otherwise stated in the HPI except for back pain, right knee pain       Past Medical History:  Diagnosis Date   Depression 2015   HLD (hyperlipidemia)     Hypertension                 Past Surgical History:  Procedure Laterality Date   APPENDECTOMY              Social History Alan Guerrero  reports that he quit smoking about 26 years ago. His smoking use included cigarettes. He has never used smokeless tobacco. He reports that he does not drink alcohol and does not use drugs.   family history includes Alcoholism in his sister; Cataracts in his mother; Diabetes in his mother; Heart disease in his maternal grandfather and mother; Hiatal hernia in his father; Hypertension in his mother; Obesity in his paternal grandmother; Prostate cancer in his paternal grandfather; Skin cancer in his father; Stroke in his maternal grandfather.   Allergies      Allergies  Allergen Reactions   Codeine Nausea And Vomiting            PHYSICAL EXAMINATION: Vital signs: BP (!) 140/78 (BP Location:  Left Arm, Patient Position: Sitting, Cuff Size: Normal)   Pulse 64   Ht 5' 5.5" (1.664 m) Comment: height measured without shoes  Wt 203 lb 8 oz (92.3 kg)   BMI 33.35 kg/m   Constitutional: generally well-appearing, no acute distress Psychiatric: alert and oriented x3, cooperative Eyes: extraocular movements intact, anicteric, conjunctiva pink Mouth: oral pharynx moist, no lesions Neck: supple no lymphadenopathy Cardiovascular: heart regular rate and rhythm, no murmur Lungs: clear to auscultation bilaterally Abdomen: soft, nontender, nondistended, no obvious ascites, no peritoneal signs, normal bowel sounds, no organomegaly Rectal: Deferred until colonoscopy Extremities: no clubbing, cyanosis, or lower extremity edema bilaterally Skin: no lesions on visible extremities Neuro: No focal deficits.  Nerves intact   ASSESSMENT:   1.  Acute uncomplicated diverticulitis.  Improved after antibiotic therapy. 2.  Colon cancer screening.  Average risk. 3.  General Medical problems.  Stable.     PLAN:   1.  Discussion on diverticulitis 2.  Schedule colonoscopy.The nature of the procedure, as well as the risks, benefits, and alternatives were carefully and thoroughly reviewed with the patient. Ample time for discussion and questions allowed. The patient understood, was satisfied, and agreed to proceed. 3.  Ongoing general medical care with Dr. Cyndia Bent

## 2023-06-29 ENCOUNTER — Telehealth: Payer: Self-pay

## 2023-06-29 NOTE — Telephone Encounter (Signed)
  Follow up Call-     06/28/2023    2:26 PM  Call back number  Post procedure Call Back phone  # [phone]  Permission to leave phone message Yes     Patient questions:  Do you have a fever, pain , or abdominal swelling? No. Pain Score  0 *  Have you tolerated food without any problems? Yes.    Have you been able to return to your normal activities? Yes.    Do you have any questions about your discharge instructions: Diet   No. Medications  No. Follow up visit  No.  Do you have questions or concerns about your Care? No.  Actions: * If pain score is 4 or above: No action needed, pain <4.

## 2023-07-05 ENCOUNTER — Encounter: Payer: Self-pay | Admitting: Internal Medicine

## 2023-07-05 LAB — SURGICAL PATHOLOGY
